# Patient Record
Sex: Male | Born: 1945 | Race: White | Hispanic: No | Smoking: Former smoker
Health system: Southern US, Community
[De-identification: ages and names within clinical notes are randomized; demographics above are authoritative.]

## PROBLEM LIST (undated history)

## (undated) HISTORY — PX: FINGER SURGERY: SHX640

## (undated) HISTORY — PX: HERNIA REPAIR: SHX51

## (undated) HISTORY — PX: FACIAL RECONSTRUCTION SURGERY: SHX631

## (undated) HISTORY — PX: ABDOMINAL SURGERY: SHX537

---

## 1998-07-27 ENCOUNTER — Emergency Department (HOSPITAL_COMMUNITY): Admission: EM | Admit: 1998-07-27 | Discharge: 1998-07-27 | Payer: Self-pay | Admitting: Emergency Medicine

## 2015-10-14 ENCOUNTER — Encounter (HOSPITAL_COMMUNITY): Payer: Self-pay

## 2015-10-14 ENCOUNTER — Emergency Department (HOSPITAL_COMMUNITY): Payer: Medicare Other

## 2015-10-14 ENCOUNTER — Emergency Department (HOSPITAL_COMMUNITY)
Admission: EM | Admit: 2015-10-14 | Discharge: 2015-10-14 | Disposition: A | Discharge status: Home / Self Care | Payer: Medicare Other | Attending: Emergency Medicine | Admitting: Emergency Medicine

## 2015-10-14 DIAGNOSIS — Y999 Unspecified external cause status: Secondary | ICD-10-CM | POA: Insufficient documentation

## 2015-10-14 DIAGNOSIS — R930 Abnormal findings on diagnostic imaging of skull and head, not elsewhere classified: Secondary | ICD-10-CM | POA: Insufficient documentation

## 2015-10-14 DIAGNOSIS — Y939 Activity, unspecified: Secondary | ICD-10-CM | POA: Insufficient documentation

## 2015-10-14 DIAGNOSIS — S80812A Abrasion, left lower leg, initial encounter: Secondary | ICD-10-CM | POA: Insufficient documentation

## 2015-10-14 DIAGNOSIS — W010XXA Fall on same level from slipping, tripping and stumbling without subsequent striking against object, initial encounter: Secondary | ICD-10-CM | POA: Insufficient documentation

## 2015-10-14 DIAGNOSIS — W19XXXA Unspecified fall, initial encounter: Secondary | ICD-10-CM

## 2015-10-14 DIAGNOSIS — S4991XA Unspecified injury of right shoulder and upper arm, initial encounter: Secondary | ICD-10-CM | POA: Diagnosis present

## 2015-10-14 DIAGNOSIS — S42201A Unspecified fracture of upper end of right humerus, initial encounter for closed fracture: Secondary | ICD-10-CM | POA: Insufficient documentation

## 2015-10-14 DIAGNOSIS — S80811A Abrasion, right lower leg, initial encounter: Secondary | ICD-10-CM | POA: Diagnosis not present

## 2015-10-14 DIAGNOSIS — F10929 Alcohol use, unspecified with intoxication, unspecified: Secondary | ICD-10-CM

## 2015-10-14 DIAGNOSIS — Z87891 Personal history of nicotine dependence: Secondary | ICD-10-CM | POA: Diagnosis not present

## 2015-10-14 DIAGNOSIS — R29898 Other symptoms and signs involving the musculoskeletal system: Secondary | ICD-10-CM

## 2015-10-14 DIAGNOSIS — Y9259 Other trade areas as the place of occurrence of the external cause: Secondary | ICD-10-CM | POA: Diagnosis not present

## 2015-10-14 DIAGNOSIS — S42291A Other displaced fracture of upper end of right humerus, initial encounter for closed fracture: Secondary | ICD-10-CM

## 2015-10-14 LAB — CBC
HCT: 49.3 % (ref 39.0–52.0)
Hemoglobin: 16.3 g/dL (ref 13.0–17.0)
MCH: 32.9 pg (ref 26.0–34.0)
MCHC: 33.1 g/dL (ref 30.0–36.0)
MCV: 99.4 fL (ref 78.0–100.0)
PLATELETS: 422 10*3/uL — AB (ref 150–400)
RBC: 4.96 MIL/uL (ref 4.22–5.81)
RDW: 12.8 % (ref 11.5–15.5)
WBC: 37.2 10*3/uL — ABNORMAL HIGH (ref 4.0–10.5)

## 2015-10-14 LAB — BASIC METABOLIC PANEL
Anion gap: 18 — ABNORMAL HIGH (ref 5–15)
BUN: 23 mg/dL — AB (ref 6–20)
CALCIUM: 8.8 mg/dL — AB (ref 8.9–10.3)
CO2: 20 mmol/L — ABNORMAL LOW (ref 22–32)
Chloride: 107 mmol/L (ref 101–111)
Creatinine, Ser: 1.42 mg/dL — ABNORMAL HIGH (ref 0.61–1.24)
GFR calc Af Amer: 57 mL/min — ABNORMAL LOW (ref >60)
GFR, EST NON AFRICAN AMERICAN: 49 mL/min — AB (ref >60)
GLUCOSE: 115 mg/dL — AB (ref 65–99)
Potassium: 4.5 mmol/L (ref 3.5–5.1)
Sodium: 145 mmol/L (ref 135–145)

## 2015-10-14 LAB — ETHANOL: Alcohol, Ethyl (B): 195 mg/dL — ABNORMAL HIGH (ref <5)

## 2015-10-14 MED ORDER — MORPHINE SULFATE (PF) 4 MG/ML IV SOLN
6.0000 mg | Freq: Once | INTRAVENOUS | Status: AC
  Administered 2015-10-14: 6 mg via INTRAVENOUS
  Filled 2015-10-14: qty 2

## 2015-10-14 MED ORDER — OXYCODONE-ACETAMINOPHEN 5-325 MG PO TABS: 2.0000 | ORAL_TABLET | Freq: Once | ORAL | Status: DC

## 2015-10-14 MED ORDER — HYDROCODONE-ACETAMINOPHEN 5-325 MG PO TABS
1.0000 | ORAL_TABLET | Freq: Once | ORAL | Status: AC
  Administered 2015-10-14: 1 via ORAL
  Filled 2015-10-14: qty 1

## 2015-10-14 MED ORDER — HYDROCODONE-ACETAMINOPHEN 5-325 MG PO TABS: 1.0000 | ORAL_TABLET | ORAL | 0 refills | Status: AC | PRN

## 2015-10-14 MED ORDER — SODIUM CHLORIDE 0.9 % IV BOLUS (SEPSIS)
1000.0000 mL | Freq: Once | INTRAVENOUS | Status: AC
  Administered 2015-10-14: 1000 mL via INTRAVENOUS

## 2015-10-14 NOTE — ED Notes (Signed)
Wound care provided to skin tears and wounds on left shoulder, left arm, rt hand and bil legs.  Dressings applied.

## 2015-10-14 NOTE — ED Triage Notes (Addendum)
Per EMS, pt attending conference.  Pt fell coming into a door. Tripped. Pt c/o left arm and shoulder pain.  Positive deformity.  Vitals 145/95, hr 123, 96% ra, fentanyl IN

## 2015-10-14 NOTE — ED Notes (Signed)
One IV attempt rt AC.  Not successful.

## 2015-10-14 NOTE — ED Notes (Signed)
Bed: [id number] Expected date: 10/14/15 Expected time: 12:14 PM Means of arrival: Ambulance Comments: Clavicle Fx ?

## 2015-10-14 NOTE — ED Provider Notes (Signed)
[facility]-EMERGENCY DEPT Provider Note   CSN: [id number] Arrival date & time: 10/14/15  1216     History   Chief Complaint Chief Complaint  Patient presents with  . Fall  . Arm Pain    HPI Stephen Barber is a 70 y.o. male.  HPI Patient presents emergency department complains of left arm and right arm pain since a fall last night.  He is visiting from [facility] and was here in town for and EMS conference.  His a Orthoptist and retired Doctor, general practice.  He reports drinking alcohol last night and falling as he was entering his hotel room.  He states when he awoke this morning he was tangled up in his chair in the left arm was underneath the arm of the chair.  He reports inability to feel his left arm and inability to squeeze his hand and dorsiflex his left wrist.  He reports weakness at the level of the left elbow as well.  Also reports pain in the left upper extremity.  Denies headache or head injury.  Denies neck pain.  Denies weakness in the legs.  No pain with range of motion of either hips.  Presents with multiple abrasions of his left side    History reviewed. No pertinent past medical history.  There are no active problems to display for this patient.   Past Surgical History:  Procedure Laterality Date  . ABDOMINAL SURGERY    . FACIAL RECONSTRUCTION SURGERY    . FINGER SURGERY    . HERNIA REPAIR         Home Medications    Prior to Admission medications   Medication Sig Start Date End Date Taking? Authorizing Provider  cetirizine (ZYRTEC) 10 MG tablet Take 10 mg by mouth daily as needed for allergies.   Yes Historical Provider, MD  FLUoxetine (PROZAC) 20 MG capsule Take 20 mg by mouth every evening.    Yes Historical Provider, MD  Multiple Vitamin (MULTI-VITAMINS) TABS Take 1 tablet by mouth every evening.    Yes Historical Provider, MD  HYDROcodone-acetaminophen (NORCO/VICODIN) 5-325 MG tablet Take 1 tablet by mouth every 4 (four) hours as needed for moderate  pain. 10/14/15   Azalia Bilis, MD    Family History History reviewed. No pertinent family history.  Social History Social History  Substance Use Topics  . Smoking status: Former Games developer  . Smokeless tobacco: Never Used  . Alcohol use Yes     Comment: occasional     Allergies   Methylparaben; Butorphanol tartrate; Carisoprodol; Sulfa antibiotics; Toradol [ketorolac tromethamine]; and Tramadol   Review of Systems Review of Systems  All other systems reviewed and are negative.    Physical Exam Updated Vital Signs BP 140/94 (BP Location: Right Arm)   Pulse 114   Temp 97.5 F (36.4 C) (Oral)   Resp 26   SpO2 96%   Physical Exam  Constitutional: He is oriented to person, place, and time. He appears well-developed and well-nourished.  HENT:  Head: Normocephalic and atraumatic.  Eyes: EOM are normal.  Neck: Normal range of motion.  Cardiovascular: Normal rate, regular rhythm, normal heart sounds and intact distal pulses.   Pulmonary/Chest: Effort normal and breath sounds normal. No respiratory distress.  Abdominal: Soft. He exhibits no distension. There is no tenderness.  Musculoskeletal:  Limited range of motion of right shoulder secondary to pain.  Normal right radial pulse.  Patient able to grip with the left hand but is unable to dorsiflex the left hand  and is weak at the level of the left elbow as well.  Normal left radial pulse.  Left upper extremities perfused.  Compartments of the left upper arm are soft.  There is noted bruising of the left upper arm.  Multiple abrasions noted to bilateral lower extremities.  Full range of motion bilateral hips, knees, ankles.  Neurological: He is alert and oriented to person, place, and time.  Skin: Skin is warm and dry.  Psychiatric: He has a normal mood and affect. Judgment normal.  Nursing note and vitals reviewed.    ED Treatments / Results  Labs (all labs ordered are listed, but only abnormal results are displayed) Labs  Reviewed  CBC - Abnormal; Notable for the following:       Result Value   WBC 37.2 (*)    Platelets 422 (*)    All other components within normal limits  BASIC METABOLIC PANEL - Abnormal; Notable for the following:    CO2 20 (*)    Glucose, Bld 115 (*)    BUN 23 (*)    Creatinine, Ser 1.42 (*)    Calcium 8.8 (*)    GFR calc non Af Amer 49 (*)    GFR calc Af Amer 57 (*)    Anion gap 18 (*)    All other components within normal limits  ETHANOL - Abnormal; Notable for the following:    Alcohol, Ethyl (B) 195 (*)    All other components within normal limits    EKG  EKG Interpretation None       Radiology Dg Shoulder Right  Result Date: 10/14/2015 CLINICAL DATA:  [place]. Pt tripped and fell coming into a door today. Pt c/o entire left arm and bilateral shoulder pain. States h/o recent right shoulder fracture in May 2017. Abrasions noted to left elbow, and anterior left shoulder. Swelling noted to left elbow. C/o left hand numbness as well. EXAM: RIGHT SHOULDER - 2+ VIEW COMPARISON:  None. FINDINGS: There is a non comminuted fracture of the proximal humerus, across the metaphysis. Fracture is mildly displaced being overlapped by approximately 15 mm and displaced lateral to the humeral head by 1 cm. No significant comminution. No other fractures. The glenohumeral and AC joints are normally aligned. Bones are demineralized. Soft tissues are unremarkable. IMPRESSION: Fracture of the proximal right humerus as described with mild displacement. Electronically Signed   By: Amie Portlandavid  Ormond M.D.   On: 10/14/2015 13:56   Dg Elbow Complete Left  Result Date: 10/14/2015 CLINICAL DATA:  Tripped and fall, now with left elbow pain. EXAM: LEFT ELBOW - COMPLETE 3+ VIEW COMPARISON:  Left humerus radiographs - earlier same day FINDINGS: The lateral radiograph is degraded due to obliquity. No displaced fracture or elbow joint effusion. Joint spaces are preserved. Regional soft  tissues appear normal. No radiopaque foreign body. IMPRESSION: No fracture or elbow joint effusion. Electronically Signed   By: Simonne ComeJohn  Watts M.D.   On: 10/14/2015 13:54   Ct Head Wo Contrast  Result Date: 10/14/2015 CLINICAL DATA:  Sudden onset of LT arm weakness s/p fall today. No loc EXAM: CT HEAD WITHOUT CONTRAST TECHNIQUE: Contiguous axial images were obtained from the base of the skull through the vertex without intravenous contrast. COMPARISON:  None. FINDINGS: Brain: There is mild generalized age related parenchymal atrophy with commensurate dilatation of the ventricles and sulci. All areas of the brain demonstrate normal gray-white matter attenuation. There is no mass, hemorrhage, edema or other evidence of acute parenchymal  abnormality. No extra-axial hemorrhage. Vascular: No hyperdense vessel or unexpected calcification. Skull: Normal. Negative for acute fracture or focal lesion. Sinuses/Orbits: Mucosal thickening within the ethmoid air cells, likely chronic. Paranasal sinuses otherwise clear. Periorbital and retro-orbital soft tissues are unremarkable. Other: None. IMPRESSION: No acute findings.  No intracranial mass, hemorrhage or edema. Electronically Signed   By: Bary Richard M.D.   On: 10/14/2015 17:49   Dg Shoulder Left  Result Date: 10/14/2015 CLINICAL DATA:  Pt attending conference in [place]. Pt tripped and fell coming into a door today. Pt c/o entire left arm and bilateral shoulder pain. States h/o recent right shoulder fracture in May 2017. Abrasions noted to left elbow, and a.*comment was truncated* EXAM: LEFT SHOULDER - 2+ VIEW COMPARISON:  None. FINDINGS: Glenohumeral joint is intact. No evidence of scapular fracture or humeral fracture. The acromioclavicular joint is intact. IMPRESSION: No fracture or dislocation. Electronically Signed   By: Genevive Bi M.D.   On: 10/14/2015 13:53   Dg Humerus Left  Result Date: 10/14/2015 CLINICAL DATA:  Pt attending conference in  [place]. Pt tripped and fell coming into a door today. Pt c/o entire left arm and bilateral shoulder pain. States h/o recent right shoulder fracture in May 2017. Abrasions noted to left elbow, and anterior left shoulder. Swelling noted to left elbow. C/o left hand numbness as well. EXAM: LEFT HUMERUS - 2+ VIEW COMPARISON:  None. FINDINGS: There is no evidence of fracture or other focal bone lesions. Soft tissues are unremarkable. IMPRESSION: Negative. Electronically Signed   By: Amie Portland M.D.   On: 10/14/2015 13:53    Procedures Procedures (including critical care time)  Medications Ordered in ED Medications  morphine 4 MG/ML injection 6 mg (6 mg Intravenous Given 10/14/15 1304)  sodium chloride 0.9 % bolus 1,000 mL (0 mLs Intravenous Stopped 10/14/15 1504)  HYDROcodone-acetaminophen (NORCO/VICODIN) 5-325 MG per tablet 1 tablet (1 tablet Oral Given 10/14/15 1504)     Initial Impression / Assessment and Plan / ED Course  I have reviewed the triage vital signs and the nursing notes.  Pertinent labs & imaging results that were available during my care of the patient were reviewed by me and considered in my medical decision making (see chart for details).  Clinical Course    Proximal right humerus fracture.  Placed in a sling.  He will need with the follow-up for this.  Left upper extremity is soft.  Left upper extremity is perfused.  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  He was tangled up in the chair overnight and was passed out secondary to significant alcohol intoxication as his alcohol level here now is still 195.  Placed in a shoulder immobilizer and will need outpatient neurology follow-up.  Head CT without abnormality.  Doubt stroke.  No C-spine tenderness.  Final Clinical Impressions(s) / ED Diagnoses   Final diagnoses:  None    New Prescriptions New Prescriptions   No  medications on file     Azalia Bilis, MD 10/14/15 1821

## 2018-05-31 IMAGING — CR DG SHOULDER 2+V*L*
3 series · 3 of 3 positions shown · non-contrast
Comparison: None.

CLINICAL DATA: Pt attending conference in [HOSPITAL]. Pt tripped
and fell coming into a door today. Pt c/o entire left arm and
bilateral shoulder pain. States h/o recent right shoulder fracture
in May 2015. Abrasions noted to left elbow, and a...*comment was
truncated*

EXAM:
LEFT SHOULDER - 2+ VIEW

[t shoulder external left (1 of 2)]
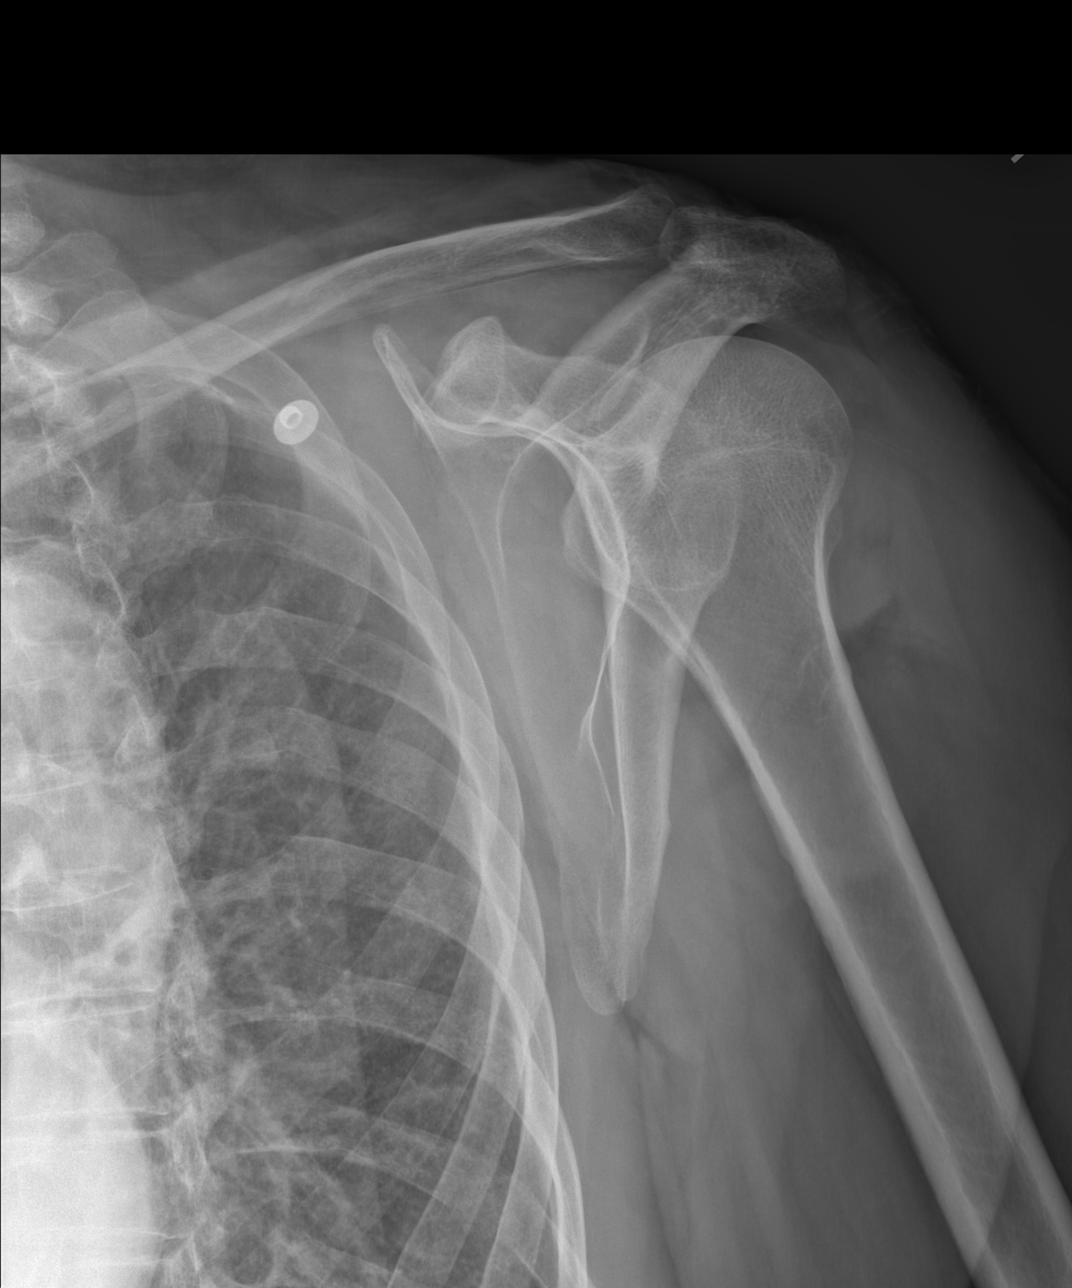

[t shoulder internal left]
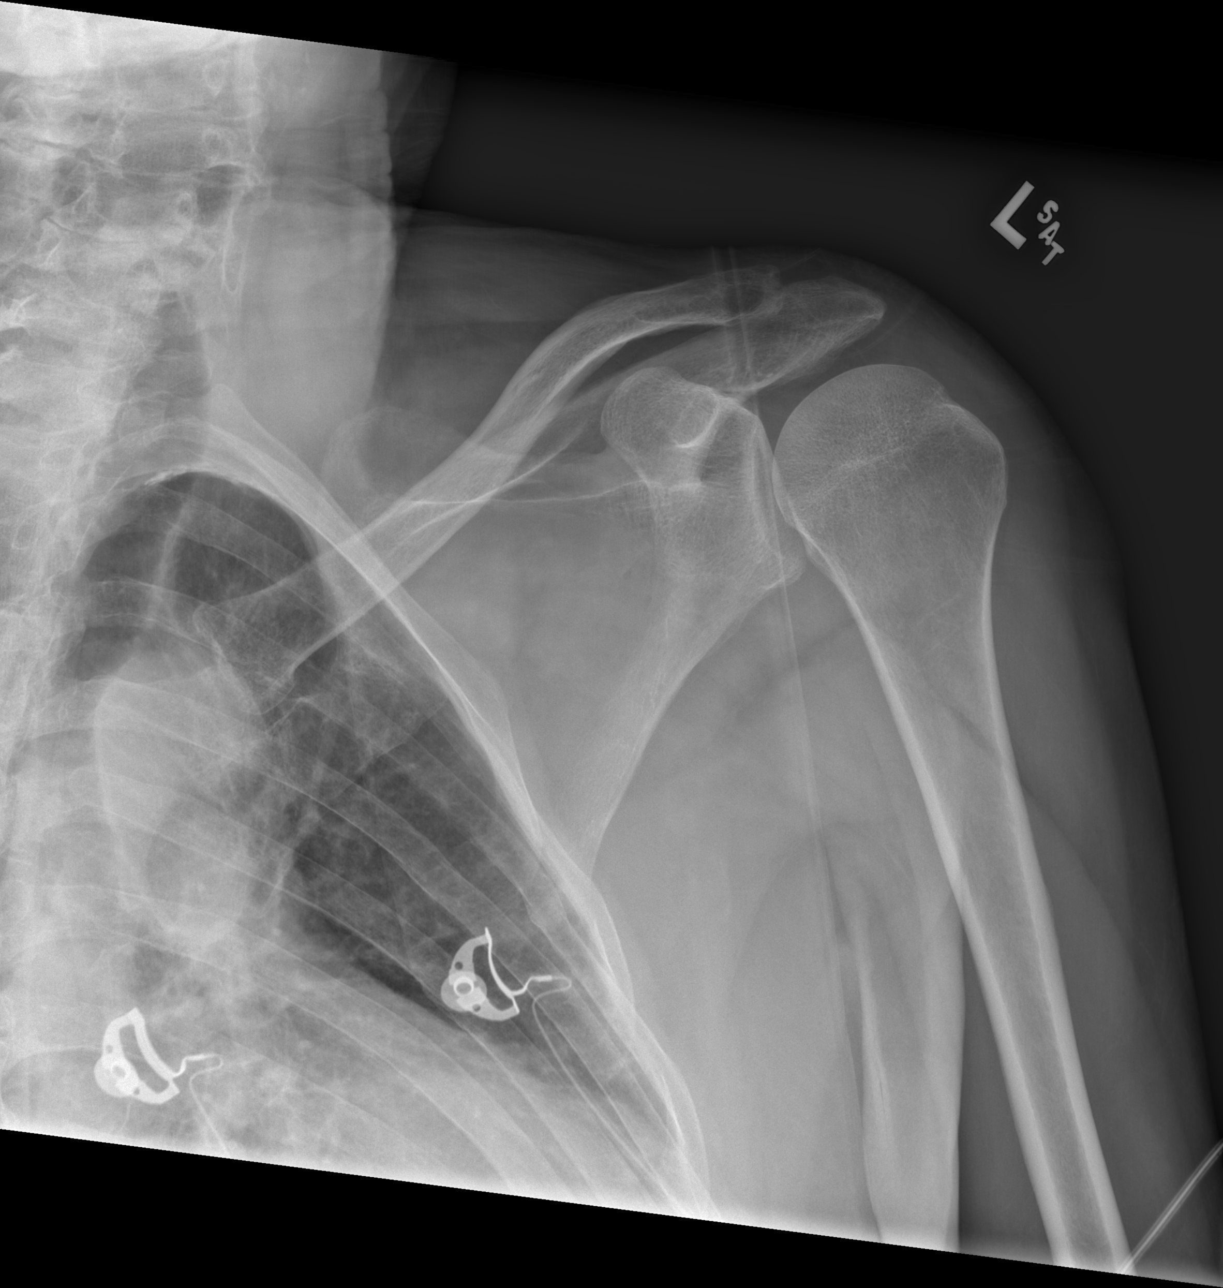

[t shoulder external left (2 of 2)]
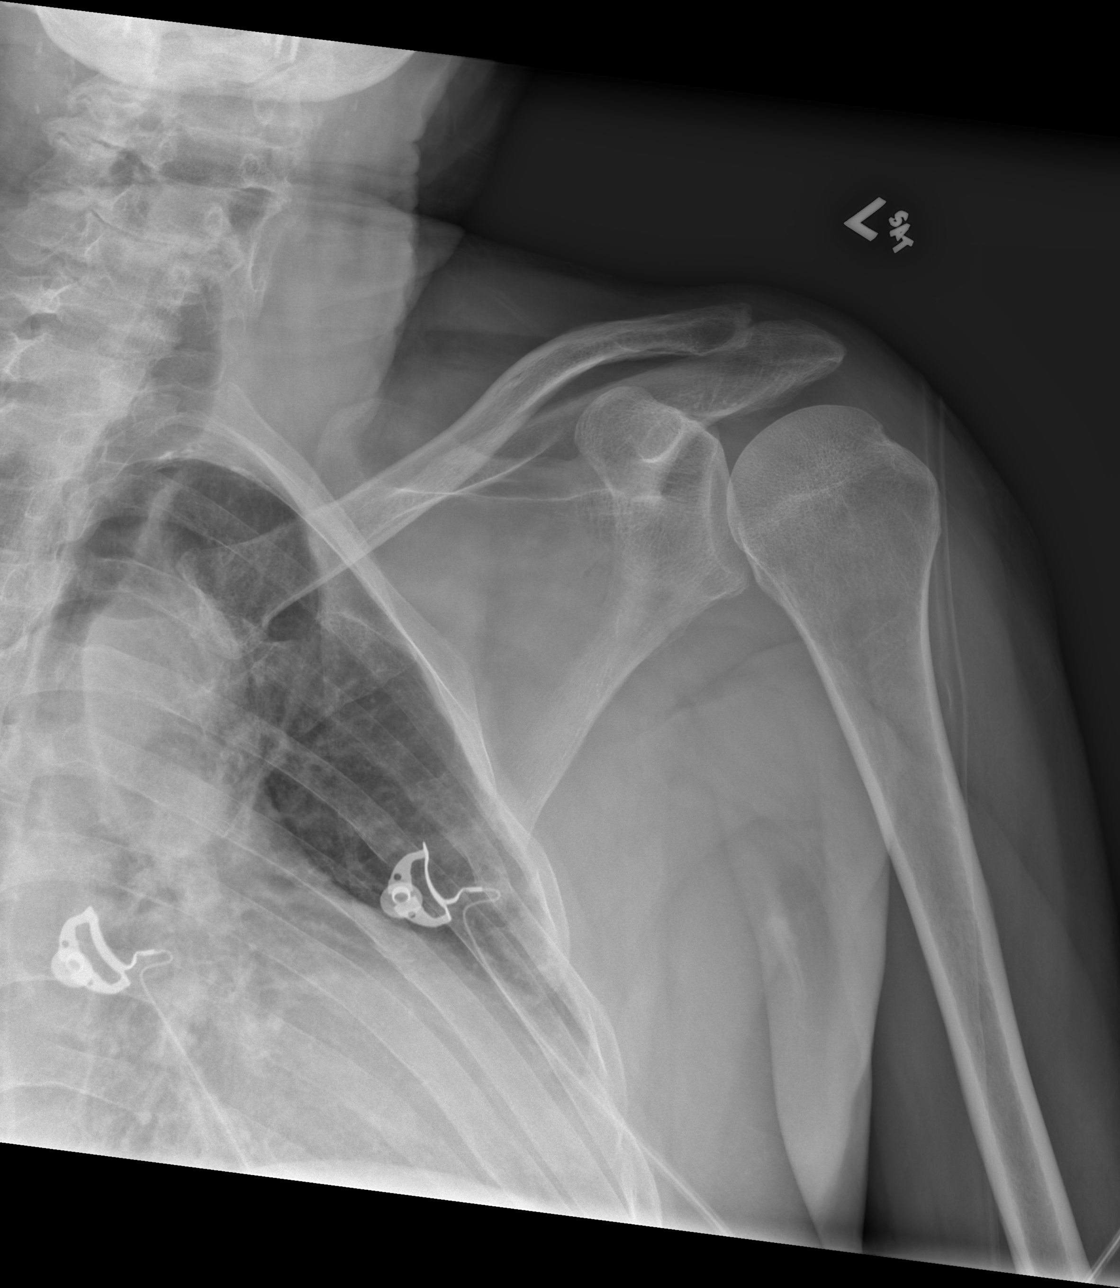

[3 of 3 positions shown; findings below may reference images not displayed]

FINDINGS: Glenohumeral joint is intact. No evidence of scapular fracture or
humeral fracture. The acromioclavicular joint is intact.
IMPRESSION: No fracture or dislocation.

## 2022-03-14 DEATH — deceased
